# Patient Record
Sex: Male | Born: 2000 | Race: Black or African American | Hispanic: No | Marital: Single | Smoking: Never smoker
Health system: Southern US, Community
[De-identification: ages and names within clinical notes are randomized; demographics above are authoritative.]

## PROBLEM LIST (undated history)

## (undated) DIAGNOSIS — J45909 Unspecified asthma, uncomplicated: Secondary | ICD-10-CM

## (undated) HISTORY — PX: ADENOIDECTOMY: SUR15

## (undated) HISTORY — PX: TONSILLECTOMY: SUR1361

## (undated) HISTORY — PX: MYRINGOTOMY: SUR874

---
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
## 2023-02-10 ENCOUNTER — Other Ambulatory Visit: Payer: Self-pay

## 2023-02-10 ENCOUNTER — Emergency Department (HOSPITAL_BASED_OUTPATIENT_CLINIC_OR_DEPARTMENT_OTHER)
Admission: EM | Admit: 2023-02-10 | Discharge: 2023-02-10 | Disposition: A | Discharge status: Home / Self Care | Payer: Medicaid Other | Attending: Emergency Medicine | Admitting: Emergency Medicine

## 2023-02-10 ENCOUNTER — Encounter (HOSPITAL_BASED_OUTPATIENT_CLINIC_OR_DEPARTMENT_OTHER): Payer: Self-pay

## 2023-02-10 ENCOUNTER — Emergency Department (HOSPITAL_BASED_OUTPATIENT_CLINIC_OR_DEPARTMENT_OTHER): Payer: Medicaid Other

## 2023-02-10 DIAGNOSIS — Z20822 Contact with and (suspected) exposure to covid-19: Secondary | ICD-10-CM | POA: Diagnosis not present

## 2023-02-10 DIAGNOSIS — Z7951 Long term (current) use of inhaled steroids: Secondary | ICD-10-CM | POA: Diagnosis not present

## 2023-02-10 DIAGNOSIS — R051 Acute cough: Secondary | ICD-10-CM

## 2023-02-10 DIAGNOSIS — R059 Cough, unspecified: Secondary | ICD-10-CM | POA: Diagnosis present

## 2023-02-10 DIAGNOSIS — J101 Influenza due to other identified influenza virus with other respiratory manifestations: Secondary | ICD-10-CM | POA: Diagnosis not present

## 2023-02-10 DIAGNOSIS — J45909 Unspecified asthma, uncomplicated: Secondary | ICD-10-CM | POA: Insufficient documentation

## 2023-02-10 DIAGNOSIS — Z91010 Allergy to peanuts: Secondary | ICD-10-CM | POA: Insufficient documentation

## 2023-02-10 DIAGNOSIS — M791 Myalgia, unspecified site: Secondary | ICD-10-CM

## 2023-02-10 LAB — RESP PANEL BY RT-PCR (RSV, FLU A&B, COVID)  RVPGX2
Influenza A by PCR: POSITIVE — AB
Influenza B by PCR: NEGATIVE
Resp Syncytial Virus by PCR: NEGATIVE
SARS Coronavirus 2 by RT PCR: NEGATIVE

## 2023-02-10 MED ORDER — OSELTAMIVIR PHOSPHATE 75 MG PO CAPS: 75.0000 mg | ORAL_CAPSULE | Freq: Two times a day (BID) | ORAL | 0 refills | Status: AC

## 2023-02-10 MED ORDER — ONDANSETRON 4 MG PO TBDP: 4.0000 mg | ORAL_TABLET | Freq: Three times a day (TID) | ORAL | 0 refills | Status: AC | PRN

## 2023-02-10 MED ORDER — ACETAMINOPHEN 325 MG PO TABS
650.0000 mg | ORAL_TABLET | Freq: Once | ORAL | Status: AC | PRN
  Administered 2023-02-10: 650 mg via ORAL
  Filled 2023-02-10: qty 2

## 2023-02-10 MED ORDER — ONDANSETRON 4 MG PO TBDP
4.0000 mg | ORAL_TABLET | Freq: Once | ORAL | Status: AC
  Administered 2023-02-10: 4 mg via ORAL
  Filled 2023-02-10: qty 1

## 2023-02-10 NOTE — Discharge Instructions (Addendum)
As discussed, you did test positive for the flu which is most likely causing symptoms.  Will send him Tamiflu; this is an antiviral that decreases duration of illness by around a day.  Will send in nausea medicine to use as needed.  Continue take Tylenol/Motrin for any pains/fever.  Please not hesitate to return to emergency department if the area some signs and symptoms we discussed to become apparent.

## 2023-02-10 NOTE — ED Triage Notes (Signed)
Pt reports fever, chills, cough and SOB that began last night. Pt endorses exposure to illness.

## 2023-02-10 NOTE — ED Provider Notes (Signed)
[facility] Provider Note   CSN: [id number] Arrival date & time: 02/10/23  8295     History  Chief Complaint  Patient presents with   Shortness of Breath    Shawn Sawyer is a 23 y.o. male.   Shortness of Breath   23 year old presents emergency department complaints of cough, congestion, body aches.  Symptom onset yesterday evening.  States that he had a few episodes of emesis overnight into this morning but has been able to tolerate Gatorade while waiting for room.  Denies any abdominal pain, chest pain, urinary symptoms, change in bowel habits.  Denies any fever but febrile upon presentation temp of 101.  Reports known sick contact through his brother who is at home has been ill for the past few days with the same symptoms.  Past medical history significant for asthma.  Home Medications Prior to Admission medications   Medication Sig Start Date End Date Taking? Authorizing Provider  ondansetron (ZOFRAN-ODT) 4 MG disintegrating tablet Take 1 tablet (4 mg total) by mouth every 8 (eight) hours as needed. 02/10/23  Yes Sherian Maroon A, PA  oseltamivir (TAMIFLU) 75 MG capsule Take 1 capsule (75 mg total) by mouth every 12 (twelve) hours. 02/10/23  Yes Sherian Maroon A, PA  albuterol-ipratropium (COMBIVENT) 18-103 MCG/ACT inhaler Inhale 1 puff into the lungs every 4 (four) hours.    [provider]  beclomethasone (QVAR) 80 MCG/ACT inhaler Inhale 1 puff into the lungs 2 (two) times daily.    [provider]  DIFFERIN 0.3 % gel APP EXT TO FACE Q NIGHT 10/24/17   [provider]  ibuprofen (ADVIL,MOTRIN) 400 MG tablet Take 1 tablet (400 mg total) by mouth every 6 (six) hours as needed. 09/25/17   Palumbo, April, MD      Allergies    Bee venom, Peanuts [peanut oil], and Penicillins    Review of Systems   Review of Systems  Respiratory:  Positive for shortness of breath.   All other systems reviewed and are  negative.   Physical Exam Updated Vital Signs BP (!) 142/83   Pulse 77   Temp 98.3 F (36.8 C)   Resp (!) 22   Ht 5\' 11"  (1.803 m)   Wt 94.3 kg   SpO2 100%   BMI 29.01 kg/m  Physical Exam Vitals and nursing note reviewed.  Constitutional:      General: He is not in acute distress.    Appearance: He is well-developed.  HENT:     Head: Normocephalic and atraumatic.     Right Ear: Tympanic membrane normal.     Left Ear: Tympanic membrane normal.     Nose: Nose normal.     Mouth/Throat:     Comments: Mild posterior pharyngeal erythema.  Uvula midline and symmetrical phonation.  No sublingual or submental swelling.  Tonsils 1+ bilaterally without exudate.  No trismus. Eyes:     Conjunctiva/sclera: Conjunctivae normal.  Cardiovascular:     Rate and Rhythm: Normal rate and regular rhythm.     Pulses: Normal pulses.     Heart sounds: No murmur heard. Pulmonary:     Effort: Pulmonary effort is normal. No respiratory distress.     Breath sounds: Normal breath sounds. No wheezing, rhonchi or rales.  Abdominal:     Palpations: Abdomen is soft.     Tenderness: There is no abdominal tenderness.  Musculoskeletal:        General: No swelling.  Cervical back: Neck supple.  Skin:    General: Skin is warm and dry.     Capillary Refill: Capillary refill takes less than 2 seconds.  Neurological:     Mental Status: He is alert.  Psychiatric:        Mood and Affect: Mood normal.     ED Results / Procedures / Treatments   Labs (all labs ordered are listed, but only abnormal results are displayed) Labs Reviewed  RESP PANEL BY RT-PCR (RSV, FLU A&B, COVID)  RVPGX2 - Abnormal; Notable for the following components:      Result Value   Influenza A by PCR POSITIVE (*)    All other components within normal limits    EKG None  Radiology DG Chest 2 View Result Date: 02/10/2023 CLINICAL DATA:  Fever.  Sick contacts EXAM: CHEST - 2 VIEW COMPARISON:  12/14/2021 FINDINGS: Normal heart  size and mediastinal contours. No acute infiltrate or edema. No effusion or pneumothorax. No acute osseous findings. IMPRESSION: No active cardiopulmonary disease. Electronically Signed   By: Tiburcio Pea M.D.   On: 02/10/2023 09:20    Procedures Procedures    Medications Ordered in ED Medications  ondansetron (ZOFRAN-ODT) disintegrating tablet 4 mg (has no administration in time range)  acetaminophen (TYLENOL) tablet 650 mg (650 mg Oral Given 02/10/23 4098)    ED Course/ Medical Decision Making/ A&P                                 Medical Decision Making Amount and/or Complexity of Data Reviewed Radiology: ordered.  Risk OTC drugs. Prescription drug management.   This patient presents to the ED for concern of cough, body aches, fever, this involves an extensive number of treatment options, and is a complaint that carries with it a high risk of complications and morbidity.  The differential diagnosis includes viral URI, COVID, flu, RSV, pneumonia, other   Co morbidities that complicate the patient evaluation  See HPI   Additional history obtained:  Additional history obtained from EMR External records from outside source obtained and reviewed including hospital records   Lab Tests:  I Ordered, and personally interpreted labs.  The pertinent results include: Viral panel positive for influenza A   Imaging Studies ordered:  I ordered imaging studies including chest x-ray I independently visualized and interpreted imaging which showed no acute cardiopulmonary abnormality. I agree with the radiologist interpretation   Cardiac Monitoring: / EKG:  The patient was maintained on a cardiac monitor.  I personally viewed and interpreted the cardiac monitored which showed an underlying rhythm of: Sinus rhythm   Consultations Obtained:  N/a   Problem List / ED Course / Critical interventions / Medication management  Influenza A, cough, myalgia I ordered medication  including Zofran   Reevaluation of the patient after these medicines showed that the patient improved I have reviewed the patients home medicines and have made adjustments as needed   Social Determinants of Health:  Denies tobacco, illicit drug use   Test / Admission - Considered:  Influenza A, cough, myalgia Vitals signs significant for initial fever of 101 which defervesced with antipyretic administration emergency department.. Otherwise within normal range and stable throughout visit. Laboratory/imaging studies significant for: See above 23 year old male presents emergency department with cough, chills, body aches that began yesterday evening..  On exam, abdomen nontender, lungs clear to auscultation bilaterally.  Viral panel positive for influenza A.  Chest x-ray  drained by triage staff which was negative for any acute cardiopulmonary abnormality.  Suspect patient's symptoms likely secondary to influenza A.  Recommend symptomatic therapy as described in AVS as well as Tamiflu as requested by patient.  Will recommend follow-up with primary care in the outpatient setting.  Treatment plan discussed at length with patient and he acknowledged understanding was agreeable to said plan.  Patient overall well-appearing, afebrile and in no acute distress. Worrisome signs and symptoms were discussed with the patient, and the patient acknowledged understanding to return to the ED if noticed. Patient was stable upon discharge.          Final Clinical Impression(s) / ED Diagnoses Final diagnoses:  Influenza A  Acute cough  Myalgia    Rx / DC Orders ED Discharge Orders          Ordered    ondansetron (ZOFRAN-ODT) 4 MG disintegrating tablet  Every 8 hours PRN        02/10/23 1159    oseltamivir (TAMIFLU) 75 MG capsule  Every 12 hours        02/10/23 1159              Peter Garter, [place] 02/10/23 1205    Pricilla Loveless, MD 02/11/23 [phone]

## 2023-02-10 NOTE — ED Notes (Signed)
Pt endorses shortness of breath since last night. Pt has been around sick contacts. HX of asthma, uses albuterol inhaler PRN. Last use was this morning 2puffs at 0737 per pt, Pt endorses cough, white in color. Clear bilateral breath sounds.
